# Patient Record
Sex: Male | Born: 1995 | Race: Black or African American | Hispanic: No | Smoking: Current every day smoker
Health system: Southern US, Community
[De-identification: ages and names within clinical notes are randomized; demographics above are authoritative.]

---

## 2017-04-13 ENCOUNTER — Encounter (HOSPITAL_COMMUNITY): Payer: Self-pay | Admitting: Emergency Medicine

## 2017-04-13 ENCOUNTER — Emergency Department (HOSPITAL_COMMUNITY)
Admission: EM | Admit: 2017-04-13 | Discharge: 2017-04-14 | Disposition: A | Discharge status: Home / Self Care | Payer: Medicaid Other | Attending: Emergency Medicine | Admitting: Emergency Medicine

## 2017-04-13 DIAGNOSIS — F17210 Nicotine dependence, cigarettes, uncomplicated: Secondary | ICD-10-CM | POA: Diagnosis not present

## 2017-04-13 DIAGNOSIS — Z202 Contact with and (suspected) exposure to infections with a predominantly sexual mode of transmission: Secondary | ICD-10-CM | POA: Diagnosis present

## 2017-04-13 DIAGNOSIS — Z113 Encounter for screening for infections with a predominantly sexual mode of transmission: Secondary | ICD-10-CM

## 2017-04-13 LAB — URINALYSIS, ROUTINE W REFLEX MICROSCOPIC
Bacteria, UA: NONE SEEN
Bilirubin Urine: NEGATIVE
GLUCOSE, UA: NEGATIVE mg/dL
HGB URINE DIPSTICK: NEGATIVE
Ketones, ur: NEGATIVE mg/dL
NITRITE: NEGATIVE
PH: 6 (ref 5.0–8.0)
Protein, ur: NEGATIVE mg/dL
SPECIFIC GRAVITY, URINE: 1.027 (ref 1.005–1.030)

## 2017-04-13 NOTE — ED Triage Notes (Signed)
Reports exposure to chlamydia. Denies s/s.

## 2017-04-13 NOTE — Discharge Instructions (Signed)
You have been tested if tests are positive, he will be called back for treatment

## 2017-04-13 NOTE — ED Provider Notes (Signed)
  [facility]-EMERGENCY DEPT Provider Note   CSN: [id number] Arrival date & time: 04/13/17  2117     History   Chief Complaint Chief Complaint  Patient presents with  . Exposure to STD    HPI Bradley Russell is a 21 y.o. male.  Girlfriend has been treated for bacterial vaginosis with antibiotics and patinet is concerned he may have an STD.  He has no symptoms of discharge, testicle pain, abdominal pain       History reviewed. No pertinent past medical history.  There are no active problems to display for this patient.   History reviewed. No pertinent surgical history.     Home Medications    Prior to Admission medications   Not on File    Family History No family history on file.  Social History Social History  Substance Use Topics  . Smoking status: Current Every Day Smoker    Types: Cigarettes  . Smokeless tobacco: Never Used  . Alcohol use Yes     Allergies   Patient has no known allergies.   Review of Systems Review of Systems  Genitourinary: Negative for discharge, dysuria and frequency.  Psychiatric/Behavioral: The patient is nervous/anxious.   All other systems reviewed and are negative.    Physical Exam Updated Vital Signs BP 123/87 (BP Location: Left Arm)   Pulse 75   Temp 97.7 F (36.5 C) (Oral)   Resp 16   Ht 5\' 9"  (1.753 m)   Wt 72.6 kg (160 lb)   SpO2 100%   BMI 23.63 kg/m   Physical Exam  Constitutional: He appears well-developed and well-nourished.  HENT:  Head: Normocephalic.  Eyes: Pupils are equal, round, and reactive to light.  Neck: Normal range of motion.  Cardiovascular: Normal rate.   Pulmonary/Chest: Effort normal.  Abdominal: Soft.  Genitourinary: Penis normal. Circumcised. No penile tenderness. No discharge found.  Neurological: He is alert.  Skin: Skin is warm.  Psychiatric: He has a normal mood and affect.  Nursing note and vitals reviewed.    ED Treatments / Results  Labs (all labs ordered are listed,  but only abnormal results are displayed) Labs Reviewed  URINALYSIS, ROUTINE W REFLEX MICROSCOPIC - Abnormal; Notable for the following:       Result Value   Leukocytes, UA SMALL (*)    Squamous Epithelial / LPF 0-5 (*)    All other components within normal limits  GC[facility]IA [facility]    EKG  EKG Interpretation None       Radiology No results found.  Procedures Procedures (including critical care time)  Medications Ordered in ED Medications - No data to display   Initial Impression / Assessment and Plan / ED Course  I have reviewed the triage vital signs and the nursing notes.  Pertinent labs & imaging results that were available during my care of the patient were reviewed by me and considered in my medical decision making (see chart for details).     Will test for GC Chlamydia Will not treat at this time due to lack of symptoms    Final Clinical Impressions(s) / ED Diagnoses   Final diagnoses:  Screen for STD (sexually transmitted disease)    New Prescriptions New Prescriptions   No medications on file     Earley FavorSchulz, Isreal Moline, NP 04/13/17 2322    Loren RacerYelverton, David, MD 04/21/17 [phone]

## 2017-04-14 LAB — GC/CHLAMYDIA PROBE AMP (~~LOC~~) NOT AT ARMC
Chlamydia: POSITIVE — AB
NEISSERIA GONORRHEA: NEGATIVE

## 2018-01-14 ENCOUNTER — Encounter (HOSPITAL_COMMUNITY): Payer: Self-pay | Admitting: Emergency Medicine

## 2018-01-14 ENCOUNTER — Emergency Department (HOSPITAL_COMMUNITY)
Admission: EM | Admit: 2018-01-14 | Discharge: 2018-01-14 | Payer: Medicaid Other | Attending: Emergency Medicine | Admitting: Emergency Medicine

## 2018-01-14 DIAGNOSIS — S0101XA Laceration without foreign body of scalp, initial encounter: Secondary | ICD-10-CM | POA: Insufficient documentation

## 2018-01-14 DIAGNOSIS — W228XXA Striking against or struck by other objects, initial encounter: Secondary | ICD-10-CM | POA: Diagnosis not present

## 2018-01-14 DIAGNOSIS — Y999 Unspecified external cause status: Secondary | ICD-10-CM | POA: Diagnosis not present

## 2018-01-14 DIAGNOSIS — Y92143 Cell of prison as the place of occurrence of the external cause: Secondary | ICD-10-CM | POA: Insufficient documentation

## 2018-01-14 DIAGNOSIS — Y93B9 Activity, other involving muscle strengthening exercises: Secondary | ICD-10-CM | POA: Diagnosis not present

## 2018-01-14 DIAGNOSIS — Z23 Encounter for immunization: Secondary | ICD-10-CM | POA: Diagnosis not present

## 2018-01-14 DIAGNOSIS — S0990XA Unspecified injury of head, initial encounter: Secondary | ICD-10-CM

## 2018-01-14 DIAGNOSIS — F17210 Nicotine dependence, cigarettes, uncomplicated: Secondary | ICD-10-CM | POA: Diagnosis not present

## 2018-01-14 MED ORDER — TETANUS-DIPHTH-ACELL PERTUSSIS 5-2.5-18.5 LF-MCG/0.5 IM SUSP
0.5000 mL | Freq: Once | INTRAMUSCULAR | Status: AC
  Administered 2018-01-14: 0.5 mL via INTRAMUSCULAR
  Filled 2018-01-14: qty 0.5

## 2018-01-14 MED ORDER — LIDOCAINE-EPINEPHRINE (PF) 2 %-1:200000 IJ SOLN
10.0000 mL | Freq: Once | INTRAMUSCULAR | Status: DC
  Filled 2018-01-14: qty 20

## 2018-01-14 NOTE — Discharge Instructions (Signed)
Your head laceration was closed with staples, these will need to be removed in 7 to 10 days.  Keep area clean and dry, you may shower but do not submerge the head underwater.  Keep a close eye in the area for signs of infection such as redness, swelling, worsening pain or drainage, if any of these occur you should be seen sooner for reevaluation.  You were examined today for a head injury and possible concussion. Based on your exam head imaging was not deemed necessary today.  Sometimes serious problems can develop after a head injury. Please return to the emergency department if you experience any of the following symptoms: Repeated vomiting Headache that gets worse and does not go away Loss of consciousness or inability to stay awake at times when you normally would be able to Getting more confused, restless or agitated Convulsions or seizures Difficulty walking or feeling off balance Weakness or numbness Vision changes

## 2018-01-14 NOTE — ED Notes (Signed)
ED Provider at bedside. 

## 2018-01-14 NOTE — ED Triage Notes (Signed)
Patient in custody, reports "doing squats on the toilet and falling." Denies LOC. Approximately one inch laceration to top of head. Bleeding controlled.

## 2018-01-14 NOTE — ED Provider Notes (Signed)
[facility]-EMERGENCY DEPT Provider Note   CSN: [id number] Arrival date & time: 01/14/18  1236     History   Chief Complaint Chief Complaint  Patient presents with  . Laceration    HPI Bradley Russell is a 22 y.o. male.  Bradley Russell is a 22 y.o. Male who is otherwise healthy, presents to the ED from present for evaluation of head injury with scalp laceration.  Patient reports he was doing exercises in his cell, and doing squats jumps off of the toilet when he jumped up and hit his head on the lower bunk.  Patient reports there was initially a lot of bleeding, he denies any loss of consciousness, reports mild headache surrounding laceration, but denies any nausea, vomiting, vision changes, dizziness, numbness, weakness or tingling in any extremities.  Patient has been acting normally since.  He has full recall of the event with no pre-or post event amnesia.  Not exhibiting any confusion or lethargy.  Patient denies any other injury, he did not fall all the way to the floor.  No neck or back pain.  Patient is unsure if his tetanus is up-to-date.  He reports he wash the area out in the shower prior to arrival.     History reviewed. No pertinent past medical history.  There are no active problems to display for this patient.   History reviewed. No pertinent surgical history.      Home Medications    Prior to Admission medications   Not on File    Family History No family history on file.  Social History Social History   Tobacco Use  . Smoking status: Current Every Day Smoker    Types: Cigarettes  . Smokeless tobacco: Never Used  Substance Use Topics  . Alcohol use: Yes  . Drug use: Not on file     Allergies   Patient has no known allergies.   Review of Systems Review of Systems  Constitutional: Negative for chills and fever.  HENT: Negative for congestion, drooling, ear discharge, ear pain, facial swelling, rhinorrhea and trouble  swallowing.   Eyes: Negative for photophobia, pain and visual disturbance.  Gastrointestinal: Negative for nausea and vomiting.  Skin: Positive for wound. Negative for color change and rash.  Neurological: Positive for headaches. Negative for dizziness, tremors, seizures, syncope, facial asymmetry, speech difficulty, weakness, light-headedness and numbness.     Physical Exam Updated Vital Signs BP 133/77 (BP Location: Right Arm)   Pulse 73   Temp 98.5 F (36.9 C) (Oral)   Resp 16   SpO2 100%   Physical Exam  Constitutional: He is oriented to person, place, and time. He appears well-developed and well-nourished. No distress.  HENT:  Head: Normocephalic.  There is a 4 cm laceration over the midportion of the frontal scalp, bleeding controlled, no evidence of foreign bodies, no other bony tenderness on the surrounding scalp, no palpable hematoma, no step-off, negative battle sign, no evidence of hemotympanum or CSF otorrhea   Eyes: Right eye exhibits no discharge. Left eye exhibits no discharge.  Neck: Normal range of motion. Neck supple.  No midline C-spine tenderness, normal range of motion in all directions  Pulmonary/Chest: Effort normal. No respiratory distress.  Neurological: He is alert and oriented to person, place, and time. Coordination normal.  Speech is clear, able to follow commands CN III-XII intact Normal strength in upper and lower extremities bilaterally including dorsiflexion and plantar flexion, strong and equal grip strength Sensation normal to light and sharp  touch Moves extremities without ataxia, coordination intact Normal finger to nose and rapid alternating movements No pronator drift  Skin: Skin is warm and dry. Capillary refill takes less than 2 seconds. He is not diaphoretic.  Psychiatric: He has a normal mood and affect. His behavior is normal.  Nursing note and vitals reviewed.    ED Treatments / Results  Labs (all labs ordered are listed, but only  abnormal results are displayed) Labs Reviewed - No data to display  EKG None  Radiology No results found.  Procedures .Marland KitchenLaceration Repair Date/Time: 01/14/2018 2:58 PM Performed by: Dartha Lodge, PA-C Authorized by: Dartha Lodge, PA-C   Consent:    Consent obtained:  Verbal   Consent given by:  Patient   Risks discussed:  Infection, pain, poor cosmetic result and poor wound healing   Alternatives discussed:  No treatment Anesthesia (see MAR for exact dosages):    Anesthesia method:  Local infiltration   Local anesthetic:  Lidocaine 2% WITH epi Laceration details:    Location:  Scalp   Scalp location:  Frontal   Length (cm):  4   Depth (mm):  5 Repair type:    Repair type:  Simple Pre-procedure details:    Preparation:  Patient was prepped and draped in usual sterile fashion Exploration:    Hemostasis achieved with:  Epinephrine and direct pressure   Wound exploration: entire depth of wound probed and visualized     Wound extent: areolar tissue violated   Treatment:    Area cleansed with:  Saline   Amount of cleaning:  Standard   Irrigation solution:  Sterile saline   Irrigation volume:  40 ml   Irrigation method:  Syringe Skin repair:    Repair method:  Staples   Number of staples:  5 Approximation:    Approximation:  Close Post-procedure details:    Dressing:  Open (no dressing)   (including critical care time)  Medications Ordered in ED Medications  lidocaine-EPINEPHrine (XYLOCAINE W/EPI) 2 %-1:200000 (PF) injection 10 mL (has no administration in time range)  Tdap (BOOSTRIX) injection 0.5 mL (0.5 mLs Intramuscular Given 01/14/18 1431)     Initial Impression / Assessment and Plan / ED Course  I have reviewed the triage vital signs and the nursing notes.  Pertinent labs & imaging results that were available during my care of the patient were reviewed by me and considered in my medical decision making (see chart for details).  Patient presents to the  ED for evaluation after he sustained 4 cm laceration to the mid scalp.  Patient reports he was exercising in his jail cell, was doing jump squats and hit the top of his head on the upper bunk, no loss of consciousness, mild ache surrounding laceration, no nausea or vomiting, vision changes or dizziness.  Normal neurologic exam.  No imaging indicated at this time.  Pressure irrigation performed. Wound explored and base of wound visualized in a bloodless field without evidence of foreign body.  Laceration repair performed with staples which was well tolerated. Tdap updated.  Pt has no comorbidities to effect normal wound healing. Pt discharged without antibiotics.  Discussed  home care with patient and answered questions. Pt to follow-up for wound check and staple removal in 7-10 days; they are to return to the ED sooner for signs of infection. Pt is hemodynamically stable with no complaints prior to dc.    Final Clinical Impressions(s) / ED Diagnoses   Final diagnoses:  Laceration of scalp, initial  encounter  Injury of head, initial encounter    ED Discharge Orders    None       Legrand Rams 01/14/18 1557    Lorre Nick, MD 01/16/18 [phone]

## 2018-01-14 NOTE — ED Notes (Signed)
Bed: [facility]3 Expected date:  Expected time:  Means of arrival:  Comments: 

## 2018-09-06 ENCOUNTER — Other Ambulatory Visit: Payer: Self-pay

## 2018-09-06 ENCOUNTER — Encounter (HOSPITAL_COMMUNITY): Payer: Self-pay | Admitting: Emergency Medicine

## 2018-09-06 ENCOUNTER — Emergency Department (HOSPITAL_COMMUNITY)
Admission: EM | Admit: 2018-09-06 | Discharge: 2018-09-06 | Disposition: A | Discharge status: Home / Self Care | Payer: Medicaid Other | Attending: Emergency Medicine | Admitting: Emergency Medicine

## 2018-09-06 DIAGNOSIS — S01111A Laceration without foreign body of right eyelid and periocular area, initial encounter: Secondary | ICD-10-CM

## 2018-09-06 DIAGNOSIS — Y9389 Activity, other specified: Secondary | ICD-10-CM | POA: Insufficient documentation

## 2018-09-06 DIAGNOSIS — S1081XA Abrasion of other specified part of neck, initial encounter: Secondary | ICD-10-CM | POA: Diagnosis not present

## 2018-09-06 DIAGNOSIS — Y929 Unspecified place or not applicable: Secondary | ICD-10-CM | POA: Insufficient documentation

## 2018-09-06 DIAGNOSIS — Y999 Unspecified external cause status: Secondary | ICD-10-CM | POA: Diagnosis not present

## 2018-09-06 DIAGNOSIS — S61213A Laceration without foreign body of left middle finger without damage to nail, initial encounter: Secondary | ICD-10-CM | POA: Diagnosis not present

## 2018-09-06 DIAGNOSIS — F17210 Nicotine dependence, cigarettes, uncomplicated: Secondary | ICD-10-CM | POA: Insufficient documentation

## 2018-09-06 DIAGNOSIS — T07XXXA Unspecified multiple injuries, initial encounter: Secondary | ICD-10-CM

## 2018-09-06 DIAGNOSIS — S51812A Laceration without foreign body of left forearm, initial encounter: Secondary | ICD-10-CM | POA: Diagnosis not present

## 2018-09-06 DIAGNOSIS — S20319A Abrasion of unspecified front wall of thorax, initial encounter: Secondary | ICD-10-CM | POA: Insufficient documentation

## 2018-09-06 MED ORDER — FLUORESCEIN SODIUM 1 MG OP STRP
1.0000 | ORAL_STRIP | Freq: Once | OPHTHALMIC | Status: AC
  Administered 2018-09-06: 1 via OPHTHALMIC
  Filled 2018-09-06: qty 1

## 2018-09-06 MED ORDER — LIDOCAINE HCL 1 % IJ SOLN
INTRAMUSCULAR | Status: AC
  Administered 2018-09-06: 20 mL
  Filled 2018-09-06: qty 20

## 2018-09-06 MED ORDER — LIDOCAINE HCL (PF) 1 % IJ SOLN
10.0000 mL | Freq: Once | INTRAMUSCULAR | Status: AC
  Administered 2018-09-06: 10 mL via INTRADERMAL

## 2018-09-06 MED ORDER — BACITRACIN ZINC 500 UNIT/GM EX OINT
TOPICAL_OINTMENT | Freq: Once | CUTANEOUS | Status: AC
Start: 2018-09-06 — End: 2018-09-06
  Administered 2018-09-06: 1 via TOPICAL

## 2018-09-06 MED ORDER — LIDOCAINE-EPINEPHRINE (PF) 2 %-1:200000 IJ SOLN
10.0000 mL | Freq: Once | INTRAMUSCULAR | Status: AC
  Administered 2018-09-06: 10 mL via INTRADERMAL
  Filled 2018-09-06: qty 20

## 2018-09-06 MED ORDER — TETRACAINE HCL 0.5 % OP SOLN
1.0000 [drp] | Freq: Once | OPHTHALMIC | Status: AC
  Administered 2018-09-06: 1 [drp] via OPHTHALMIC
  Filled 2018-09-06: qty 4
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
## 2018-09-06 NOTE — ED Notes (Signed)
RN cleaned multiple wounds on patients arms, chest, and face.

## 2018-09-06 NOTE — Discharge Instructions (Signed)
Keep the wound clean and dry for the first 24 hours. After that you may gently clean the wound with soap and water. Make sure to pat dry the wound before covering it with any dressing. You can use topical antibiotic ointment and bandage. Ice and elevate for pain relief.  ° °You can take Tylenol or Ibuprofen as directed for pain. You can alternate Tylenol and Ibuprofen every 4 hours for additional pain relief.  ° °Return to the Emergency Department, your primary care doctor, or the [facility] in 5-7 days for suture removal.  ° °Monitor closely for any signs of infection. Return to the Emergency Department for any worsening redness/swelling of the area that begins to spread, drainage from the site, worsening pain, fever or any other worsening or concerning symptoms.  ° ° °

## 2019-05-16 ENCOUNTER — Ambulatory Visit (HOSPITAL_COMMUNITY)
Admission: EM | Admit: 2019-05-16 | Discharge: 2019-05-16 | Disposition: A | Discharge status: Home / Self Care | Payer: Medicaid Other | Attending: Internal Medicine | Admitting: Internal Medicine

## 2019-05-16 ENCOUNTER — Encounter (HOSPITAL_COMMUNITY): Payer: Self-pay | Admitting: Emergency Medicine

## 2019-05-16 ENCOUNTER — Other Ambulatory Visit: Payer: Self-pay

## 2019-05-16 DIAGNOSIS — H6001 Abscess of right external ear: Secondary | ICD-10-CM | POA: Diagnosis not present

## 2019-05-16 DIAGNOSIS — J069 Acute upper respiratory infection, unspecified: Secondary | ICD-10-CM

## 2019-05-16 MED ORDER — CEPHALEXIN 500 MG PO CAPS: 500.0000 mg | ORAL_CAPSULE | Freq: Four times a day (QID) | ORAL | 0 refills | Status: DC

## 2019-05-16 MED ORDER — BENZONATATE 100 MG PO CAPS: 100.0000 mg | ORAL_CAPSULE | Freq: Three times a day (TID) | ORAL | 0 refills | Status: DC | PRN

## 2019-05-16 NOTE — ED Provider Notes (Signed)
[facility]    CSN: [id number] Arrival date & time: 05/16/19  0920      History   Chief Complaint Chief Complaint  Patient presents with  . Nasal Congestion    HPI Bradley Russell is a 23 y.o. male with a 2-day history of cough, sore throat and runny nose.  Symptoms started yesterday and seemed to have improved overnight.  This morning when he went out to smoke he had an episode of runny nose, mild cough and has a visit to the urgent care. No nausea or vomiting. No diarrhea.   Patient also complains of painful swelling of the right earlobe.  This started about a couple of weeks ago and has been worsening.  No trauma to the ear.  No fever or chills.  She has not tried any over-the-counter medication.  Part of the swelling drained some purulent material mixed with blood. HPI  History reviewed. No pertinent past medical history.  There are no active problems to display for this patient.   History reviewed. No pertinent surgical history.     Home Medications    Prior to Admission medications   Medication Sig Start Date End Date Taking? Authorizing Provider  benzonatate (TESSALON) 100 MG capsule Take 1 capsule (100 mg total) by mouth 3 (three) times daily as needed for cough. 05/16/19   Tasnim Balentine, Myrene Galas, MD  cephALEXin (KEFLEX) 500 MG capsule Take 1 capsule (500 mg total) by mouth 4 (four) times daily. 05/16/19   Lamara Brecht, Myrene Galas, MD    Family History No family history on file.  Social History Social History   Tobacco Use  . Smoking status: Current Every Day Smoker    Types: Cigarettes  . Smokeless tobacco: Never Used  Substance Use Topics  . Alcohol use: Yes  . Drug use: Not on file     Allergies   Patient has no known allergies.   Review of Systems Review of Systems  Constitutional: Positive for activity change. Negative for appetite change, fatigue, fever and unexpected weight change.  HENT: Positive for congestion, postnasal drip, rhinorrhea and  sore throat. Negative for ear discharge, ear pain, mouth sores, nosebleeds, sinus pressure and sinus pain.   Respiratory: Positive for cough. Negative for shortness of breath, wheezing and stridor.   Cardiovascular: Negative.      Physical Exam Triage Vital Signs ED Triage Vitals  Enc Vitals Group     BP 05/16/19 1014 137/72     Pulse Rate 05/16/19 1014 76     Resp 05/16/19 1014 16     Temp 05/16/19 1014 98.1 F (36.7 C)     Temp src --      SpO2 05/16/19 1014 97 %     Weight --      Height --      Head Circumference --      Peak Flow --      Pain Score 05/16/19 1015 0     Pain Loc --      Pain Edu? --      Excl. in [place]? --    No data found.  Updated Vital Signs BP 137/72   Pulse 76   Temp 98.1 F (36.7 C)   Resp 16   SpO2 97%   Visual Acuity Right Eye Distance:   Left Eye Distance:   Bilateral Distance:    Right Eye Near:   Left Eye Near:    Bilateral Near:     Physical Exam Constitutional:  General: He is not in acute distress.    Appearance: Normal appearance. He is not ill-appearing or toxic-appearing.  HENT:     Right Ear: Tympanic membrane and ear canal normal. There is no impacted cerumen.     Left Ear: Tympanic membrane and ear canal normal. There is no impacted cerumen.     Nose: Congestion present.  Eyes:     Extraocular Movements: Extraocular movements intact.     Conjunctiva/sclera: Conjunctivae normal.  Cardiovascular:     Pulses: Normal pulses.     Heart sounds: Normal heart sounds.  Pulmonary:     Effort: Pulmonary effort is normal.     Breath sounds: Normal breath sounds.  Abdominal:     General: Bowel sounds are normal.     Palpations: Abdomen is soft.  Neurological:     Mental Status: He is alert.      UC Treatments / Results  Labs (all labs ordered are listed, but only abnormal results are displayed) Labs Reviewed - No data to display  EKG   Radiology No results found.  Procedures Incision and Drainage   Date/Time: 05/16/2019 4:54 PM Performed by: Merrilee Jansky, MD Authorized by: Merrilee Jansky, MD   Consent:    Consent obtained:  Verbal   Consent given by:  Patient   Risks discussed:  Bleeding and infection   Alternatives discussed:  No treatment and delayed treatment Location:    Type:  Abscess   Location:  Head   Head location:  R external ear Pre-procedure details:    Skin preparation:  Betadine Anesthesia (see MAR for exact dosages):    Anesthesia method:  Local infiltration   Local anesthetic:  Lidocaine 2% w/o epi Procedure type:    Complexity:  Simple Procedure details:    Incision types:  Single straight   Incision depth:  Subcutaneous   Scalpel blade:  10   Wound management:  Probed and deloculated   Drainage:  Bloody and purulent   Drainage amount:  Scant   Wound treatment:  Wound left open   Packing materials:  1/2 in iodoform gauze Post-procedure details:    Patient tolerance of procedure:  Tolerated well, no immediate complications   (including critical care time)  Medications Ordered in UC Medications - No data to display  Initial Impression / Assessment and Plan / UC Course  I have reviewed the triage vital signs and the nursing notes.  Pertinent labs & imaging results that were available during my care of the patient were reviewed by me and considered in my medical decision making (see chart for details).     1. Acute nasopharyngitis:  Supportive care with use of Tylenol as needed for pain or fever Warm salt water gargles Maintain oral fluid intake Patient was tested negative for coronavirus in the past month.  He refuses to call with testing at this time  2.  Right earlobe swelling with drainage: Incision and drainage Cephalexin 500 mg every 6 hours for 7 days. Incision and drainage care teaching was given to patient. Final Clinical Impressions(s) / UC Diagnoses   Final diagnoses:  URI, acute  Abscess of earlobe, right   Discharge  Instructions   None    ED Prescriptions    Medication Sig Dispense Auth. Provider   benzonatate (TESSALON) 100 MG capsule Take 1 capsule (100 mg total) by mouth 3 (three) times daily as needed for cough. 21 capsule Hiroyuki Ozanich, Britta Mccreedy, MD   cephALEXin (KEFLEX) 500 MG capsule Take  1 capsule (500 mg total) by mouth 4 (four) times daily. 20 capsule Dezmon Conover, Britta MccreedyPhilip O, MD     Controlled Substance Prescriptions [place] Controlled Substance Registry consulted? No   Merrilee JanskyLamptey, Allianna Beaubien O, MD 05/18/19 [phone]
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
## 2020-07-10 ENCOUNTER — Other Ambulatory Visit: Payer: Self-pay

## 2020-07-10 ENCOUNTER — Ambulatory Visit (HOSPITAL_COMMUNITY)
Admission: EM | Admit: 2020-07-10 | Discharge: 2020-07-10 | Disposition: A | Discharge status: Home / Self Care | Payer: Medicaid Other | Attending: Emergency Medicine | Admitting: Emergency Medicine

## 2020-07-10 ENCOUNTER — Encounter (HOSPITAL_COMMUNITY): Payer: Self-pay | Admitting: Emergency Medicine

## 2020-07-10 DIAGNOSIS — Z202 Contact with and (suspected) exposure to infections with a predominantly sexual mode of transmission: Secondary | ICD-10-CM | POA: Insufficient documentation

## 2020-07-10 MED ORDER — DOXYCYCLINE HYCLATE 100 MG PO CAPS: 100.0000 mg | ORAL_CAPSULE | Freq: Two times a day (BID) | ORAL | 0 refills | Status: AC
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
## 2020-07-10 NOTE — Discharge Instructions (Addendum)
You are being treated for chlamydia. Refrain from any sexual activity until tests resulted and treatment completed, condoms with all future sexual activity. Follow up with local health dept or your PCP for additional std testing(HIV,syphillis, etc).

## 2020-07-13 LAB — CYTOLOGY, (ORAL, ANAL, URETHRAL) ANCILLARY ONLY
Chlamydia: POSITIVE — AB
Comment: NEGATIVE
Comment: NEGATIVE
Comment: NORMAL
Neisseria Gonorrhea: NEGATIVE
Trichomonas: NEGATIVE

## 2020-09-09 ENCOUNTER — Other Ambulatory Visit: Payer: Self-pay

## 2020-09-09 ENCOUNTER — Encounter (HOSPITAL_COMMUNITY): Payer: Self-pay

## 2020-09-09 ENCOUNTER — Ambulatory Visit (HOSPITAL_COMMUNITY)
Admission: EM | Admit: 2020-09-09 | Discharge: 2020-09-09 | Disposition: A | Discharge status: Home / Self Care | Payer: Medicaid Other | Attending: Emergency Medicine | Admitting: Emergency Medicine

## 2020-09-09 DIAGNOSIS — Z113 Encounter for screening for infections with a predominantly sexual mode of transmission: Secondary | ICD-10-CM | POA: Insufficient documentation

## 2020-09-09 NOTE — ED Provider Notes (Signed)
[facility]    CSN: [id number] Arrival date & time: 09/09/20  1012      History   Chief Complaint Chief Complaint  Patient presents with  . STD testing    HPI Bradley Russell is a 25 y.o. male.   Bradley Russell presents with requests for std screen. No specific known exposure. He was treated for chlamydia in November of last year, states his partner is asking him to re-test. He denies any new partners. He inquires about herpes screening today, denies any sores, lesions, or ulcerations to penis, genitalia or oral mucosa. Denies any penile discharge or dysuria.    ROS per HPI, negative if not otherwise mentioned.      History reviewed. No pertinent past medical history.  Patient Active Problem List   Diagnosis Date Noted  . STD exposure 07/10/2020    History reviewed. No pertinent surgical history.     Home Medications    Prior to Admission medications   Medication Sig Start Date End Date Taking? Authorizing Provider  benzonatate (TESSALON) 100 MG capsule Take 1 capsule (100 mg total) by mouth 3 (three) times daily as needed for cough. 05/16/19   Lamptey, Britta Mccreedy, MD  cephALEXin (KEFLEX) 500 MG capsule Take 1 capsule (500 mg total) by mouth 4 (four) times daily. 05/16/19   Lamptey, Britta Mccreedy, MD    Family History History reviewed. No pertinent family history.  Social History Social History   Tobacco Use  . Smoking status: Current Every Day Smoker    Types: Cigarettes  . Smokeless tobacco: Never Used  Substance Use Topics  . Alcohol use: Yes     Allergies   Patient has no known allergies.   Review of Systems Review of Systems   Physical Exam Triage Vital Signs ED Triage Vitals  Enc Vitals Group     BP 09/09/20 1121 137/67     Pulse Rate 09/09/20 1121 73     Resp 09/09/20 1121 19     Temp 09/09/20 1121 98 F (36.7 C)     Temp src --      SpO2 09/09/20 1121 100 %     Weight --      Height --      Head Circumference --      Peak  Flow --      Pain Score 09/09/20 1120 0     Pain Loc --      Pain Edu? --      Excl. in GC? --    No data found.  Updated Vital Signs BP 137/67   Pulse 73   Temp 98 F (36.7 C)   Resp 19   SpO2 100%   Visual Acuity Right Eye Distance:   Left Eye Distance:   Bilateral Distance:    Right Eye Near:   Left Eye Near:    Bilateral Near:     Physical Exam Constitutional:      Appearance: He is well-developed.  Cardiovascular:     Rate and Rhythm: Normal rate.  Pulmonary:     Effort: Pulmonary effort is normal.  Abdominal:     Palpations: Abdomen is not rigid.     Tenderness: Negative signs include Murphy's sign and McBurney's sign.     Comments: Denies scrotal redness, swelling, pain; denies sores or lesions; gu exam deferred   Skin:    General: Skin is warm and dry.  Neurological:     Mental Status: He is alert and oriented to person,  place, and time.      UC Treatments / Results  Labs (all labs ordered are listed, but only abnormal results are displayed) Labs Reviewed  CYTOLOGY, (ORAL, ANAL, URETHRAL) ANCILLARY ONLY    EKG   Radiology No results found.  Procedures Procedures (including critical care time)  Medications Ordered in UC Medications - No data to display  Initial Impression / Assessment and Plan / UC Course  I have reviewed the triage vital signs and the nursing notes.  Pertinent labs & imaging results that were available during my care of the patient were reviewed by me and considered in my medical decision making (see chart for details).     Std screen via penile cytology collected and pending. Discussed herpes as well and requirement for lesion to collect culture, here at urgent care. Safe sex encouraged. Patient verbalized understanding and agreeable to plan.   Final Clinical Impressions(s) / UC Diagnoses   Final diagnoses:  Screen for STD (sexually transmitted disease)     Discharge Instructions     We will notify of you any  positive findings or if any changes to treatment are needed. If normal or otherwise without concern to your results, we will not call you. Please log on to your MyChart to review your results if interested in so.   Use condoms to prevent stds.  If you develop any sore, lesions, ulcerations or otherwise concerned about herpes lesion you may return for testing to obtain culture of this.    ED Prescriptions    None     PDMP not reviewed this encounter.   Georgetta Haber, NP 09/09/20 1137

## 2020-09-09 NOTE — ED Triage Notes (Signed)
Pt in requesting STD testing  Denies any symptoms

## 2020-09-09 NOTE — Discharge Instructions (Signed)
We will notify of you any positive findings or if any changes to treatment are needed. If normal or otherwise without concern to your results, we will not call you. Please log on to your MyChart to review your results if interested in so.   Use condoms to prevent stds.  If you develop any sore, lesions, ulcerations or otherwise concerned about herpes lesion you may return for testing to obtain culture of this.

## 2020-09-10 LAB — CYTOLOGY, (ORAL, ANAL, URETHRAL) ANCILLARY ONLY
Chlamydia: NEGATIVE
Comment: NEGATIVE
Comment: NEGATIVE
Comment: NORMAL
Neisseria Gonorrhea: NEGATIVE
Trichomonas: NEGATIVE

## 2020-10-05 ENCOUNTER — Encounter (HOSPITAL_COMMUNITY): Payer: Self-pay | Admitting: Emergency Medicine

## 2020-10-05 ENCOUNTER — Other Ambulatory Visit: Payer: Self-pay

## 2020-10-05 ENCOUNTER — Emergency Department (HOSPITAL_COMMUNITY)
Admission: EM | Admit: 2020-10-05 | Discharge: 2020-10-05 | Disposition: A | Discharge status: Home / Self Care | Payer: Medicaid Other | Attending: Emergency Medicine | Admitting: Emergency Medicine

## 2020-10-05 DIAGNOSIS — F17210 Nicotine dependence, cigarettes, uncomplicated: Secondary | ICD-10-CM | POA: Insufficient documentation

## 2020-10-05 DIAGNOSIS — S0101XA Laceration without foreign body of scalp, initial encounter: Secondary | ICD-10-CM | POA: Diagnosis not present

## 2020-10-05 DIAGNOSIS — S30810A Abrasion of lower back and pelvis, initial encounter: Secondary | ICD-10-CM | POA: Diagnosis not present

## 2020-10-05 DIAGNOSIS — S1091XA Abrasion of unspecified part of neck, initial encounter: Secondary | ICD-10-CM | POA: Insufficient documentation

## 2020-10-05 DIAGNOSIS — S40011A Contusion of right shoulder, initial encounter: Secondary | ICD-10-CM | POA: Insufficient documentation

## 2020-10-05 DIAGNOSIS — Z23 Encounter for immunization: Secondary | ICD-10-CM | POA: Diagnosis not present

## 2020-10-05 DIAGNOSIS — S20319A Abrasion of unspecified front wall of thorax, initial encounter: Secondary | ICD-10-CM | POA: Diagnosis not present

## 2020-10-05 DIAGNOSIS — S0990XA Unspecified injury of head, initial encounter: Secondary | ICD-10-CM | POA: Diagnosis present

## 2020-10-05 DIAGNOSIS — T148XXA Other injury of unspecified body region, initial encounter: Secondary | ICD-10-CM

## 2020-10-05 MED ORDER — TETANUS-DIPHTH-ACELL PERTUSSIS 5-2.5-18.5 LF-MCG/0.5 IM SUSY
0.5000 mL | PREFILLED_SYRINGE | Freq: Once | INTRAMUSCULAR | Status: AC
  Administered 2020-10-05: 0.5 mL via INTRAMUSCULAR
  Filled 2020-10-05: qty 0.5

## 2020-10-05 MED ORDER — LIDOCAINE HCL (PF) 1 % IJ SOLN
5.0000 mL | Freq: Once | INTRAMUSCULAR | Status: AC
  Administered 2020-10-05: 5 mL
  Filled 2020-10-05: qty 30

## 2020-10-05 MED ORDER — ACETAMINOPHEN 325 MG PO TABS
650.0000 mg | ORAL_TABLET | Freq: Once | ORAL | Status: AC
  Administered 2020-10-05: 650 mg via ORAL
  Filled 2020-10-05: qty 2
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
## 2020-10-05 NOTE — Discharge Instructions (Addendum)
Staples repair Keep the laceration site dry for the next 24 hours and leave the dressing in place. After 24 hours you may remove the dressing and gently clean the laceration site with antibacterial soap and warm water. Do not scrub the area. Do not soak the area and water for long periods of time. Don't use hydrogen peroxide, iodine-based solutions, or alcohol, which can slow healing, and will probably be painful! Apply topical bacitracin 1-2 times per day for the next 3-5 days. Return to the emergency department in 7-10 days for removal of the staples.  You should return sooner for any signs of infection which would include increased redness around the wound, increased swelling, new drainage of yellow pus.
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
## 2021-09-01 ENCOUNTER — Emergency Department (HOSPITAL_COMMUNITY)
Admission: EM | Admit: 2021-09-01 | Discharge: 2021-09-01 | Payer: Medicaid Other | Attending: Emergency Medicine | Admitting: Emergency Medicine

## 2021-09-01 ENCOUNTER — Other Ambulatory Visit: Payer: Self-pay

## 2021-09-01 ENCOUNTER — Encounter (HOSPITAL_COMMUNITY): Payer: Self-pay

## 2021-09-01 DIAGNOSIS — Z23 Encounter for immunization: Secondary | ICD-10-CM | POA: Insufficient documentation

## 2021-09-01 DIAGNOSIS — S0181XA Laceration without foreign body of other part of head, initial encounter: Secondary | ICD-10-CM | POA: Insufficient documentation

## 2021-09-01 MED ORDER — TETANUS-DIPHTH-ACELL PERTUSSIS 5-2.5-18.5 LF-MCG/0.5 IM SUSY
0.5000 mL | PREFILLED_SYRINGE | Freq: Once | INTRAMUSCULAR | Status: AC
  Administered 2021-09-01: 0.5 mL via INTRAMUSCULAR

## 2021-09-01 NOTE — ED Notes (Signed)
An After Visit Summary was printed and given to the patient. Discharge instructions given and no further questions at this time.  Pt leaving in police custody.  
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
## 2021-09-17 ENCOUNTER — Encounter (HOSPITAL_COMMUNITY): Payer: Self-pay

## 2021-09-17 ENCOUNTER — Other Ambulatory Visit: Payer: Self-pay

## 2021-09-17 ENCOUNTER — Emergency Department (HOSPITAL_COMMUNITY)
Admission: EM | Admit: 2021-09-17 | Discharge: 2021-09-17 | Disposition: A | Discharge status: Home / Self Care | Payer: Medicaid Other | Attending: Emergency Medicine | Admitting: Emergency Medicine

## 2021-09-17 DIAGNOSIS — K047 Periapical abscess without sinus: Secondary | ICD-10-CM | POA: Diagnosis not present

## 2021-09-17 DIAGNOSIS — K0889 Other specified disorders of teeth and supporting structures: Secondary | ICD-10-CM

## 2021-09-17 MED ORDER — AMOXICILLIN-POT CLAVULANATE 875-125 MG PO TABS: 1.0000 | ORAL_TABLET | Freq: Two times a day (BID) | ORAL | 0 refills | Status: DC

## 2021-09-17 NOTE — ED Provider Notes (Signed)
[facility]-EMERGENCY DEPT Provider Note   CSN: [id number] Arrival date & time: 09/17/21  0458     History  Chief Complaint  Patient presents with   Dental Pain    Bradley Russell is a 25 y.o. male with no significant past medical history who presents with right side of mouth pain, tooth pain especially with cold liquids, hot liquids, as well as new swelling below right side of jaw.  Patient reports the hard spot under his jaw developed around 2 days ago.  Patient believes that he is having some issues with his wisdom teeth.  Patient reports it has been a while since he is seeing a dentist, he does believe he has known cavities.  Patient denies sour taste in mouth, history of dental abscess, difficulty swallowing, difficulty breathing.  Denies fever, chills.   Dental Pain     Home Medications Prior to Admission medications   Medication Sig Start Date End Date Taking? Authorizing Provider  amoxicillin-clavulanate (AUGMENTIN) 875-125 MG tablet Take 1 tablet by mouth every 12 (twelve) hours. 09/17/21  Yes Hayle Parisi H, PA-C  benzonatate (TESSALON) 100 MG capsule Take 1 capsule (100 mg total) by mouth 3 (three) times daily as needed for cough. 05/16/19   Lamptey, Britta Mccreedy, MD  cephALEXin (KEFLEX) 500 MG capsule Take 1 capsule (500 mg total) by mouth 4 (four) times daily. 05/16/19   Lamptey, Britta Mccreedy, MD      Allergies    Patient has no known allergies.    Review of Systems   Review of Systems  HENT:  Positive for dental problem.   All other systems reviewed and are negative.  Physical Exam Updated Vital Signs BP (!) 151/84 (BP Location: Right Arm)    Pulse 64    Temp 98.8 F (37.1 C) (Oral)    Resp 17    Ht 5\' 8"  (1.727 m)    SpO2 100%    BMI 25.85 kg/m  Physical Exam Vitals and nursing note reviewed.  Constitutional:      General: He is not in acute distress.    Appearance: Normal appearance.  HENT:     Head: Normocephalic and atraumatic.      Mouth/Throat:     Comments: Patient with some poor dentition noted on the bottom right.  There are no dental abscesses noted, no redness, irritation of gums.  No floor of mouth swelling.  Intact swallow reflex.  Clear oropharynx.  No peritonsillar abscess, uvula midline. Eyes:     General:        Right eye: No discharge.        Left eye: No discharge.  Neck:     Comments: Palpable lymph node submandibular on the right.  There is no redness, it is somewhat tender to the touch, it is firm and mobile.  No other palpable lymph nodes noted in the cervical lymph chain. Cardiovascular:     Rate and Rhythm: Normal rate and regular rhythm.  Pulmonary:     Effort: Pulmonary effort is normal. No respiratory distress.     Breath sounds: No stridor.  Musculoskeletal:        General: No deformity.  Skin:    General: Skin is warm and dry.  Neurological:     Mental Status: He is alert and oriented to person, place, and time.  Psychiatric:        Mood and Affect: Mood normal.        Behavior: Behavior normal.  ED Results / Procedures / Treatments   Labs (all labs ordered are listed, but only abnormal results are displayed) Labs Reviewed - No data to display  EKG None  Radiology No results found.  Procedures Procedures    Medications Ordered in ED Medications - No data to display  ED Course/ Medical Decision Making/ A&P                           Medical Decision Making Risk Prescription drug management.   Is an overall well-appearing patient who presents with concern for dental pain, new right-sided jaw swelling.  My differential diagnosis includes dental pain, dental abscess, peritonsillar abscess, Ludwig angina, superior vena cava versus malignancy.  This is not an exhaustive differential.  Patient with stable vital signs.  He has a risk factor, comorbidity of poor health literacy.  My physical exam is significant for no evidence of floor of mouth swelling, stable vital signs,  palpable submandibular right lymph node.  Patient with no stridor, no difficulty breathing, clear breath sounds throughout.  Discussed with patient that his presentation is consistent with dental pain, dental infection with reactive lymph node.  We will prescribe Augmentin, and encouraged follow-up with dentist.  No evidence of dental abscess, no evidence of peritonsillar abscess, Ludwig angina.  Airway is patent.  Discharged in stable condition at this time, return precautions given.  Encouraged to stick to soft diet, avoid extreme hot or cold foods. Final Clinical Impression(s) / ED Diagnoses Final diagnoses:  Pain, dental  Dental infection    Rx / DC Orders ED Discharge Orders          Ordered    amoxicillin-clavulanate (AUGMENTIN) 875-125 MG tablet  Every 12 hours        09/17/21 0903              Olene Floss, PA-C 09/17/21 1601    Cathren Laine, MD 09/17/21 [phone]

## 2021-09-17 NOTE — ED Triage Notes (Signed)
Pt reports issues with his wisdom teeth and new "quarter sized hard spot" under right jaw. Area developed about 2 days ago.

## 2021-09-17 NOTE — Discharge Instructions (Addendum)
Please use Tylenol or ibuprofen for pain.  You may use 600 mg ibuprofen every 6 hours or 1000 mg of Tylenol every 6 hours.  You may choose to alternate between the 2.  This would be most effective.  Not to exceed 4 g of Tylenol within 24 hours.  Not to exceed 3200 mg ibuprofen 24 hours.  Please follow up with dentistry at your earliest convenience. I have prescribed you an antibiotic. Please take the entire course as prescribed. I recommend taking on a full stomach, taking with probiotics.

## 2022-01-04 ENCOUNTER — Encounter (HOSPITAL_COMMUNITY): Payer: Self-pay

## 2022-01-04 ENCOUNTER — Other Ambulatory Visit: Payer: Self-pay

## 2022-01-04 ENCOUNTER — Emergency Department (HOSPITAL_COMMUNITY)
Admission: EM | Admit: 2022-01-04 | Discharge: 2022-01-04 | Disposition: A | Discharge status: Home / Self Care | Payer: Medicaid Other | Attending: Emergency Medicine | Admitting: Emergency Medicine

## 2022-01-04 ENCOUNTER — Emergency Department (HOSPITAL_COMMUNITY): Payer: Medicaid Other

## 2022-01-04 DIAGNOSIS — S0990XA Unspecified injury of head, initial encounter: Secondary | ICD-10-CM | POA: Diagnosis present

## 2022-01-04 DIAGNOSIS — S60222A Contusion of left hand, initial encounter: Secondary | ICD-10-CM | POA: Insufficient documentation

## 2022-01-04 DIAGNOSIS — S01112A Laceration without foreign body of left eyelid and periocular area, initial encounter: Secondary | ICD-10-CM | POA: Diagnosis not present

## 2022-01-04 DIAGNOSIS — S63501A Unspecified sprain of right wrist, initial encounter: Secondary | ICD-10-CM | POA: Insufficient documentation
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
## 2022-01-04 NOTE — Discharge Instructions (Addendum)
Your x-rays and CT scan did not show any injuries or abnormal findings. ?Use the Ace wrap and the wrist brace.  I have applied glue to the wound on your left eyebrow area which should heal the area and peel off when it is ready. ?Return to the ER if you start to experience worsening pain, increased swelling, blurry vision, loss of consciousness, chest pain. ?
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
## 2022-06-09 ENCOUNTER — Ambulatory Visit (HOSPITAL_COMMUNITY)
Admission: EM | Admit: 2022-06-09 | Discharge: 2022-06-09 | Disposition: A | Discharge status: Home / Self Care | Payer: Medicaid Other | Attending: Emergency Medicine | Admitting: Emergency Medicine

## 2022-06-09 ENCOUNTER — Ambulatory Visit (INDEPENDENT_AMBULATORY_CARE_PROVIDER_SITE_OTHER): Payer: Medicaid Other

## 2022-06-09 ENCOUNTER — Encounter (HOSPITAL_COMMUNITY): Payer: Self-pay | Admitting: Emergency Medicine

## 2022-06-09 DIAGNOSIS — S60451A Superficial foreign body of left index finger, initial encounter: Secondary | ICD-10-CM

## 2022-06-09 DIAGNOSIS — Z23 Encounter for immunization: Secondary | ICD-10-CM | POA: Diagnosis not present

## 2022-06-09 DIAGNOSIS — M79645 Pain in left finger(s): Secondary | ICD-10-CM

## 2022-06-09 MED ORDER — CEFTRIAXONE SODIUM 500 MG IJ SOLR
500.0000 mg | Freq: Once | INTRAMUSCULAR | Status: AC
  Administered 2022-06-09: 500 mg via INTRAMUSCULAR

## 2022-06-09 MED ORDER — TETANUS-DIPHTH-ACELL PERTUSSIS 5-2.5-18.5 LF-MCG/0.5 IM SUSY
0.5000 mL | PREFILLED_SYRINGE | Freq: Once | INTRAMUSCULAR | Status: AC
  Administered 2022-06-09: 0.5 mL via INTRAMUSCULAR

## 2022-06-09 MED ORDER — DOXYCYCLINE HYCLATE 100 MG PO CAPS: 100.0000 mg | ORAL_CAPSULE | Freq: Two times a day (BID) | ORAL | 0 refills | Status: AC

## 2022-06-09 MED ORDER — CEFTRIAXONE SODIUM 500 MG IJ SOLR
INTRAMUSCULAR | Status: AC
  Filled 2022-06-09: qty 500

## 2022-06-09 MED ORDER — TETANUS-DIPHTH-ACELL PERTUSSIS 5-2.5-18.5 LF-MCG/0.5 IM SUSY
PREFILLED_SYRINGE | INTRAMUSCULAR | Status: AC
  Filled 2022-06-09: qty 0.5

## 2022-06-09 MED ORDER — LIDOCAINE HCL 2 % IJ SOLN
INTRAMUSCULAR | Status: AC
  Filled 2022-06-09: qty 20

## 2022-06-09 MED ORDER — LIDOCAINE HCL (PF) 1 % IJ SOLN
INTRAMUSCULAR | Status: AC
  Filled 2022-06-09: qty 2

## 2022-06-09 NOTE — ED Provider Notes (Addendum)
[facility]    CSN: [id number] Arrival date & time: 06/09/22  1215      History   Chief Complaint Chief Complaint  Patient presents with   Foreign Body in Skin    HPI Bradley Russell is a 26 y.o. male.  Patient complaining of foreign body in left index finger, this happened 2 nights ago.  Patient states onset of symptoms began after "running through the woods and coming into contact with some tree branches".  Patient states upon onset he felt the sensation of a possible tree branch being in finger and had an abrasion on his finger.  Patient states he has increased swelling and pain to finger.  Patient denies any numbness, tingling, fever, nausea, or vomiting.  Patient states he has been holding his hand up to decrease pain.  Patient has not taken any medications for symptoms. Patient is unaware as to when last Tdap vaccination occurred.   HPI  History reviewed. No pertinent past medical history.  Patient Active Problem List   Diagnosis Date Noted   STD exposure 07/10/2020    History reviewed. No pertinent surgical history.     Home Medications    Prior to Admission medications   Medication Sig Start Date End Date Taking? Authorizing Provider  doxycycline (VIBRAMYCIN) 100 MG capsule Take 1 capsule (100 mg total) by mouth 2 (two) times daily. 06/09/22  Yes Debby Freiberg, NP    Family History History reviewed. No pertinent family history.  Social History Social History   Tobacco Use   Smoking status: Every Day    Types: Cigarettes   Smokeless tobacco: Never  Substance Use Topics   Alcohol use: Yes     Allergies   Patient has no known allergies.   Review of Systems Review of Systems  Constitutional:  Negative for chills, fatigue and fever.  Gastrointestinal:  Negative for nausea and vomiting.  Musculoskeletal:        LFT index finger swelling and pain.   Skin:  Positive for color change (Reports redness to LFT index finger) and wound  (Reports from entry site of foreign body in LFT index finger).  Neurological:  Negative for numbness.     Physical Exam Triage Vital Signs ED Triage Vitals  Enc Vitals Group     BP 06/09/22 1311 110/63     Pulse Rate 06/09/22 1311 85     Resp 06/09/22 1311 14     Temp 06/09/22 1311 98.7 F (37.1 C)     Temp Source 06/09/22 1311 Oral     SpO2 06/09/22 1311 99 %     Weight --      Height --      Head Circumference --      Peak Flow --      Pain Score 06/09/22 1310 9     Pain Loc --      Pain Edu? --      Excl. in GC? --    No data found.  Updated Vital Signs BP 110/63 (BP Location: Left Arm)   Pulse 85   Temp 98.7 F (37.1 C) (Oral)   Resp 14   SpO2 99%      Physical Exam Vitals and nursing note reviewed.  Constitutional:      Appearance: Normal appearance. He is not ill-appearing.  Musculoskeletal:     Right hand: Swelling, deformity and tenderness present. Decreased range of motion. Normal sensation. Normal capillary refill. Normal pulse.     Left hand:  Normal.     Comments: Swelling, deformity, decreased ROM, and tenderness to the whole span of LFT index finger.   Skin:    General: Skin is warm.     Capillary Refill: Capillary refill takes less than 2 seconds.     Findings: Erythema (LFT index finger) and wound present.     Comments: Wound present to dorsal side of LFT index finger. Purulent drainage at sight of injury. Picture of finger was placed in chart under media tab.   Neurological:     Mental Status: He is alert.      UC Treatments / Results  Labs (all labs ordered are listed, but only abnormal results are displayed) Labs Reviewed - No data to display  EKG   Radiology DG Finger Index Left  Result Date: 06/09/2022 CLINICAL DATA:  Swelling and erythema EXAM: LEFT INDEX FINGER 3V COMPARISON:  None Available. FINDINGS: No evidence of fracture or dislocation. No evidence of arthropathy or other focal bone abnormality. Soft tissue swelling of the  index finger. IMPRESSION: No acute osseous abnormality. Electronically Signed   By: Allegra Lai M.D.   On: 06/09/2022 14:36    Procedures Foreign Body Removal  Date/Time: 06/09/2022 4:02 PM  Performed by: Debby Freiberg, NP Authorized by: Debby Freiberg, NP   Consent:    Consent obtained:  Verbal   Consent given by:  Patient   Risks, benefits, and alternatives were discussed: yes     Risks discussed:  Infection, incomplete removal, pain and bleeding   Alternatives discussed:  No treatment Universal protocol:    Procedure explained and questions answered to patient or proxy's satisfaction: yes     Imaging studies available: yes     Patient identity confirmed:  Verbally with patient Location:    Location:  Finger   Finger location:  L index finger   Depth:  Intradermal Pre-procedure details:    Imaging:  X-ray   Neurovascular status: intact   Anesthesia:    Anesthesia method:  Local infiltration   Local anesthetic:  Lidocaine 2% w/o epi Procedure type:    Procedure complexity:  Complex Procedure details:    Incision length:  1 cm   Localization method:  Probed   Dissection of underlying tissues: no     Removal mechanism:  Hemostat, irrigation and forceps   Foreign bodies recovered: a black fragment of unknown object. Post-procedure details:    Neurovascular status: intact     Confirmation:  Residual foreign bodies remain   Skin closure:  None   Dressing:  Bulky dressing   Procedure completion:  Tolerated well, no immediate complications Comments:     Patient will be following up with Chiaramonti MD, [facility]   (including critical care time)  Medications Ordered in UC Medications  Tdap (BOOSTRIX) injection 0.5 mL (0.5 mLs Intramuscular Given 06/09/22 1345)  cefTRIAXone (ROCEPHIN) injection 500 mg (500 mg Intramuscular Given 06/09/22 1522)    Initial Impression / Assessment and Plan / UC Course  I have reviewed the triage vital signs and the  nursing notes.  Pertinent labs & imaging results that were available during my care of the patient were reviewed by me and considered in my medical decision making (see chart for details).  Clinical Course as of 06/09/22 1609  Thu Jun 09, 2022  1426 DG Finger Index Left [NW]  1436 DG Finger Index Left [NW]    Clinical Course User Index [NW] Debby Freiberg, NP    Patient was  seen for foreign body in left index finger. Tdap vaccination was given in office.  X-ray of the left index was performed which was negative for any fracture or any trauma to joint/bone. Patient was made aware of the necessity for referral to hand specialist due to extent of the injury.  Hand consult was done, this writer spoke to [employer] and was told to make an incision on the medial part of the finger and place the patient on antibiotics and for patient to schedule an appointment with Chiaramonti MD [facility] for follow up.  Procedure was performed at the medial aspect of the finger where puncture wound was located where initial injury occurred.  Fluid and some black debris was removed from the site.  The procedure was unsuccessful in taking out the actual foreign object.  The site was left open so that drainage has an exit point, possibly a foreign object will begin to be pushed out by the body. Rocephin 500 mg was given in office and doxycycline was sent to the pharmacy for possible infection of the finger due to the extent of swelling and pain.  Patient was made aware of the importance of finishing the antibiotics.  Patient was made aware of red flag symptoms that would warrant an emergency department visit.  Patient verbalized understanding of instructions. Final Clinical Impressions(s) / UC Diagnoses   Final diagnoses:  Foreign body of left index finger     Discharge Instructions      I have sent doxycycline to the pharmacy, this is an antibiotic to prevent further infection of your hand,  you will take this medication twice a day for the next 7 days.  Please go ahead and schedule an appointment with the hand specialist as attached to your paperwork, you need to schedule an appointment and ensure that you follow-up as soon as possible.  Please go to the emergency department if you have loss of sensation/feeling in your hands, worsening pain, or change in color to your hand.   These keep the site of of the injury clean.  There's information at the back of your paperwork on how to keep the site clean.     ED Prescriptions     Medication Sig Dispense Auth. Provider   doxycycline (VIBRAMYCIN) 100 MG capsule Take 1 capsule (100 mg total) by mouth 2 (two) times daily. 14 capsule Flossie Dibble, NP      PDMP not reviewed this encounter.   Flossie Dibble, NP 06/09/22 1457    Flossie Dibble, NP 06/09/22 1612    Flossie Dibble, NP 06/09/22 1612    Flossie Dibble, NP 06/13/22 [phone]

## 2022-06-09 NOTE — Discharge Instructions (Addendum)
I have sent doxycycline to the pharmacy, this is an antibiotic to prevent further infection of your hand, you will take this medication twice a day for the next 7 days.  Please go ahead and schedule an appointment with the hand specialist as attached to your paperwork, you need to schedule an appointment and ensure that you follow-up as soon as possible.  Please go to the emergency department if you have loss of sensation/feeling in your hands, worsening pain, or change in color to your hand.   These keep the site of of the injury clean.  There's information at the back of your paperwork on how to keep the site clean.

## 2022-06-09 NOTE — ED Triage Notes (Signed)
Pt has splinter in left index finger for about 2 days. C/o pain and swelling.

## 2023-02-27 ENCOUNTER — Emergency Department (HOSPITAL_COMMUNITY)
Admission: EM | Admit: 2023-02-27 | Discharge: 2023-02-27 | Payer: Medicaid Other | Attending: Emergency Medicine | Admitting: Emergency Medicine

## 2023-02-27 ENCOUNTER — Encounter (HOSPITAL_COMMUNITY): Payer: Self-pay | Admitting: Emergency Medicine

## 2023-02-27 DIAGNOSIS — Z23 Encounter for immunization: Secondary | ICD-10-CM | POA: Insufficient documentation

## 2023-02-27 DIAGNOSIS — S61412A Laceration without foreign body of left hand, initial encounter: Secondary | ICD-10-CM | POA: Insufficient documentation

## 2023-02-27 DIAGNOSIS — W228XXA Striking against or struck by other objects, initial encounter: Secondary | ICD-10-CM | POA: Diagnosis not present

## 2023-02-27 DIAGNOSIS — S60521A Blister (nonthermal) of right hand, initial encounter: Secondary | ICD-10-CM | POA: Diagnosis not present

## 2023-02-27 DIAGNOSIS — T148XXA Other injury of unspecified body region, initial encounter: Secondary | ICD-10-CM

## 2023-02-27 DIAGNOSIS — S61210A Laceration without foreign body of right index finger without damage to nail, initial encounter: Secondary | ICD-10-CM | POA: Insufficient documentation

## 2023-02-27 DIAGNOSIS — S6991XA Unspecified injury of right wrist, hand and finger(s), initial encounter: Secondary | ICD-10-CM | POA: Diagnosis present

## 2023-02-27 DIAGNOSIS — S60522A Blister (nonthermal) of left hand, initial encounter: Secondary | ICD-10-CM | POA: Insufficient documentation

## 2023-02-27 DIAGNOSIS — T07XXXA Unspecified multiple injuries, initial encounter: Secondary | ICD-10-CM

## 2023-02-27 MED ORDER — MUPIROCIN CALCIUM 2 % EX CREA: 1.0000 | TOPICAL_CREAM | Freq: Two times a day (BID) | CUTANEOUS | 0 refills | Status: AC

## 2023-02-27 MED ORDER — LIDOCAINE-EPINEPHRINE 2 %-1:100000 IJ SOLN
1.7000 mL | Freq: Once | INTRAMUSCULAR | Status: AC
  Administered 2023-02-27: 1.7 mL
  Filled 2023-02-27: qty 1

## 2023-02-27 MED ORDER — BACITRACIN ZINC 500 UNIT/GM EX OINT
TOPICAL_OINTMENT | Freq: Two times a day (BID) | CUTANEOUS | Status: DC
  Administered 2023-02-27: 4 via TOPICAL
  Filled 2023-02-27: qty 2.7

## 2023-02-27 MED ORDER — CEPHALEXIN 500 MG PO CAPS: 500.0000 mg | ORAL_CAPSULE | Freq: Four times a day (QID) | ORAL | 0 refills | Status: AC

## 2023-02-27 MED ORDER — TETANUS-DIPHTH-ACELL PERTUSSIS 5-2.5-18.5 LF-MCG/0.5 IM SUSY
0.5000 mL | PREFILLED_SYRINGE | Freq: Once | INTRAMUSCULAR | Status: AC
  Administered 2023-02-27: 0.5 mL via INTRAMUSCULAR
  Filled 2023-02-27: qty 0.5

## 2023-02-27 MED ORDER — LIDOCAINE HCL (PF) 1 % IJ SOLN
2.0000 mL | Freq: Once | INTRAMUSCULAR | Status: AC
Start: 2023-02-27 — End: 2023-02-27
  Administered 2023-02-27: 2 mL
  Filled 2023-02-27: qty 30

## 2023-02-27 NOTE — ED Provider Notes (Signed)
[place] [facility] DEPARTMENT AT [facility] Provider Note   CSN: [id number] Arrival date & time: 02/27/23  0458     History  Chief Complaint  Patient presents with   Laceration    Bradley Russell is a 27 y.o. male.  The history is provided by the patient.  Laceration Location:  Hand Hand laceration location:  Dorsum of R hand, dorsum of L hand, L palm, R palm, L fingers and R fingers Length:  Multiple lacerations that are denuded, 2 cm laceration over right index PIP Depth:  Through dermis Quality: jagged   Bleeding: controlled   Time since incident: minutes. Associated symptoms: no fever   Patient with [employer] and hit 2 tail lights and then also scraped hands on ground.  Has denuded blisters and skin on the palms and denuded lacerations on and onw jagged laceration R index PIP.  Unknown tetanus.       Home Medications Prior to Admission medications   Medication Sig Start Date End Date Taking? Authorizing Provider  cephALEXin (KEFLEX) 500 MG capsule Take 1 capsule (500 mg total) by mouth 4 (four) times daily. 02/27/23  Yes Quandarius Nill, MD  mupirocin cream (BACTROBAN) 2 % Apply 1 Application topically 2 (two) times daily. 02/27/23  Yes Taegan Haider, MD  doxycycline (VIBRAMYCIN) 100 MG capsule Take 1 capsule (100 mg total) by mouth 2 (two) times daily. 06/09/22   Debby Freiberg, NP      Allergies    Patient has no known allergies.    Review of Systems   Review of Systems  Constitutional:  Negative for fever.  HENT:  Negative for facial swelling.   Respiratory:  Negative for wheezing and stridor.   Gastrointestinal:  Negative for vomiting.  Musculoskeletal:  Negative for joint swelling.  Skin:  Positive for wound.  Neurological:  Negative for dizziness and facial asymmetry.  All other systems reviewed and are negative.   Physical Exam Updated Vital Signs BP 121/77 (BP Location: Right Arm)   Pulse 89   Temp 98.2 F (36.8 C) (Oral)   Resp 18   Wt  77.1 kg   SpO2 99%   BMI 25.85 kg/m  Physical Exam Vitals and nursing note reviewed.  Constitutional:      General: He is not in acute distress.    Appearance: He is well-developed. He is not diaphoretic.  HENT:     Head: Normocephalic and atraumatic.     Nose: Nose normal.  Eyes:     Conjunctiva/sclera: Conjunctivae normal.     Pupils: Pupils are equal, round, and reactive to light.  Cardiovascular:     Rate and Rhythm: Normal rate and regular rhythm.  Pulmonary:     Effort: Pulmonary effort is normal.     Breath sounds: Normal breath sounds. No wheezing or rales.  Abdominal:     General: Bowel sounds are normal.     Palpations: Abdomen is soft.     Tenderness: There is no abdominal tenderness. There is no guarding or rebound.  Musculoskeletal:        General: No tenderness. Normal range of motion.     Right wrist: No bony tenderness, snuff box tenderness or crepitus. Normal pulse.     Left wrist: No bony tenderness, snuff box tenderness or crepitus. Normal pulse.     Right hand: Laceration present. No tenderness or bony tenderness. Normal range of motion. Normal strength. Normal sensation. There is no disruption of two-point discrimination.     Left  hand: No tenderness or bony tenderness. Normal range of motion. Normal strength. Normal sensation. There is no disruption of two-point discrimination.       Hands:     Cervical back: Normal range of motion and neck supple.  Skin:    General: Skin is warm and dry.  Neurological:     General: No focal deficit present.     Mental Status: He is alert and oriented to person, place, and time.     Deep Tendon Reflexes: Reflexes normal.  Psychiatric:        Mood and Affect: Mood normal.        Behavior: Behavior normal.     ED Results / Procedures / Treatments   Labs (all labs ordered are listed, but only abnormal results are displayed) Labs Reviewed - No data to display  EKG None  Radiology No results  found.  Procedures .Marland KitchenLaceration Repair  Date/Time: 02/27/2023 6:53 AM  Performed by: Cy Blamer, MD Authorized by: Cy Blamer, MD   Consent:    Consent obtained:  Verbal   Consent given by:  Patient   Risks discussed:  Infection, need for additional repair, nerve damage, pain, poor cosmetic result, poor wound healing, retained foreign body, tendon damage and vascular damage   Alternatives discussed:  No treatment Universal protocol:    Patient identity confirmed:  Arm band Anesthesia:    Anesthesia method:  Local infiltration   Local anesthetic:  Lidocaine 1% w/o epi Laceration details:    Location:  Finger   Finger location:  R index finger   Length (cm):  2   Depth (mm):  1 Pre-procedure details:    Preparation:  Patient was prepped and draped in usual sterile fashion Exploration:    Imaging outcome: foreign body not noted     Wound exploration: wound explored through full range of motion     Wound extent: fascia not violated, no foreign body, no signs of injury, no nerve damage, no tendon damage, no underlying fracture and no vascular damage     Contaminated: no   Treatment:    Area cleansed with:  Povidone-iodine, chlorhexidine and saline   Amount of cleaning:  Extensive   Irrigation solution:  Sterile saline   Debridement:  None Skin repair:    Repair method:  Sutures   Suture size:  4-0   Suture material:  Nylon   Suture technique:  Simple interrupted   Number of sutures:  4 Approximation:    Approximation:  Close Repair type:    Repair type:  Simple Post-procedure details:    Dressing:  Sterile dressing   Procedure completion:  Tolerated well, no immediate complications Comments:     Superficial dorsal lacerations dermabond applied      Medications Ordered in ED Medications  bacitracin ointment (has no administration in time range)  Tdap (BOOSTRIX) injection 0.5 mL (0.5 mLs Intramuscular Given 02/27/23 0509)  lidocaine-EPINEPHrine (XYLOCAINE W/EPI)  2 %-1:100000 (with pres) injection 1.7 mL (1.7 mLs Other Given by Other 02/27/23 1610)  lidocaine (PF) (XYLOCAINE) 1 % injection 2 mL (2 mLs Other Given by Other 02/27/23 9604)    ED Course/ Medical Decision Making/ A&P                             Medical Decision Making Lacerations from punching tail light   Amount and/or Complexity of Data Reviewed Independent Historian:     Details: Police see above  External  Data Reviewed: notes.    Details: Previous notes reviewed   Risk OTC drugs. Prescription drug management. Risk Details: Will start keflex due to dirty nature of wounds.  Mupirocin also ordered BID.  Suture removal in 10 days at urgent care.  Stable for discharge.  Strict return    Final Clinical Impression(s) / ED Diagnoses Final diagnoses:  Abrasions of multiple sites  Superficial laceration  Blister  Laceration of right index finger without foreign body without damage to nail, initial encounter   Return for intractable cough, coughing up blood, fevers > 100.4 unrelieved by medication, shortness of breath, intractable vomiting, chest pain, shortness of breath, weakness, numbness, changes in speech, facial asymmetry, abdominal pain, passing out, Inability to tolerate liquids or food, cough, altered mental status or any concerns. No signs of systemic illness or infection. The patient is nontoxic-appearing on exam and vital signs are within normal limits.  I have reviewed the triage vital signs and the nursing notes. Pertinent labs & imaging results that were available during my care of the patient were reviewed by me and considered in my medical decision making (see chart for details). After history, exam, and medical workup I feel the patient has been appropriately medically screened and is safe for discharge home. Pertinent diagnoses were discussed with the patient. Patient was given return precautions.  Rx / DC Orders ED Discharge Orders          Ordered    mupirocin cream  (BACTROBAN) 2 %  2 times daily        02/27/23 0539    cephALEXin (KEFLEX) 500 MG capsule  4 times daily        02/27/23 0639              Yovanny Coats, MD 02/27/23 1610

## 2023-02-27 NOTE — ED Triage Notes (Signed)
Pt arrives with [employer] with pain and lacerations to hands after punching out 2 tail lights with his bare hands. EDP at bedside during triage. Last tetanus unknown.

## 2023-04-17 IMAGING — DX DG WRIST COMPLETE 3+V*R*
4 series · 4 of 4 positions shown · non-contrast
Comparison: None Available.

CLINICAL DATA: Right wrist injury

EXAM:
RIGHT WRIST - COMPLETE 3+ VIEW

[wrist ap (1 of 2)]
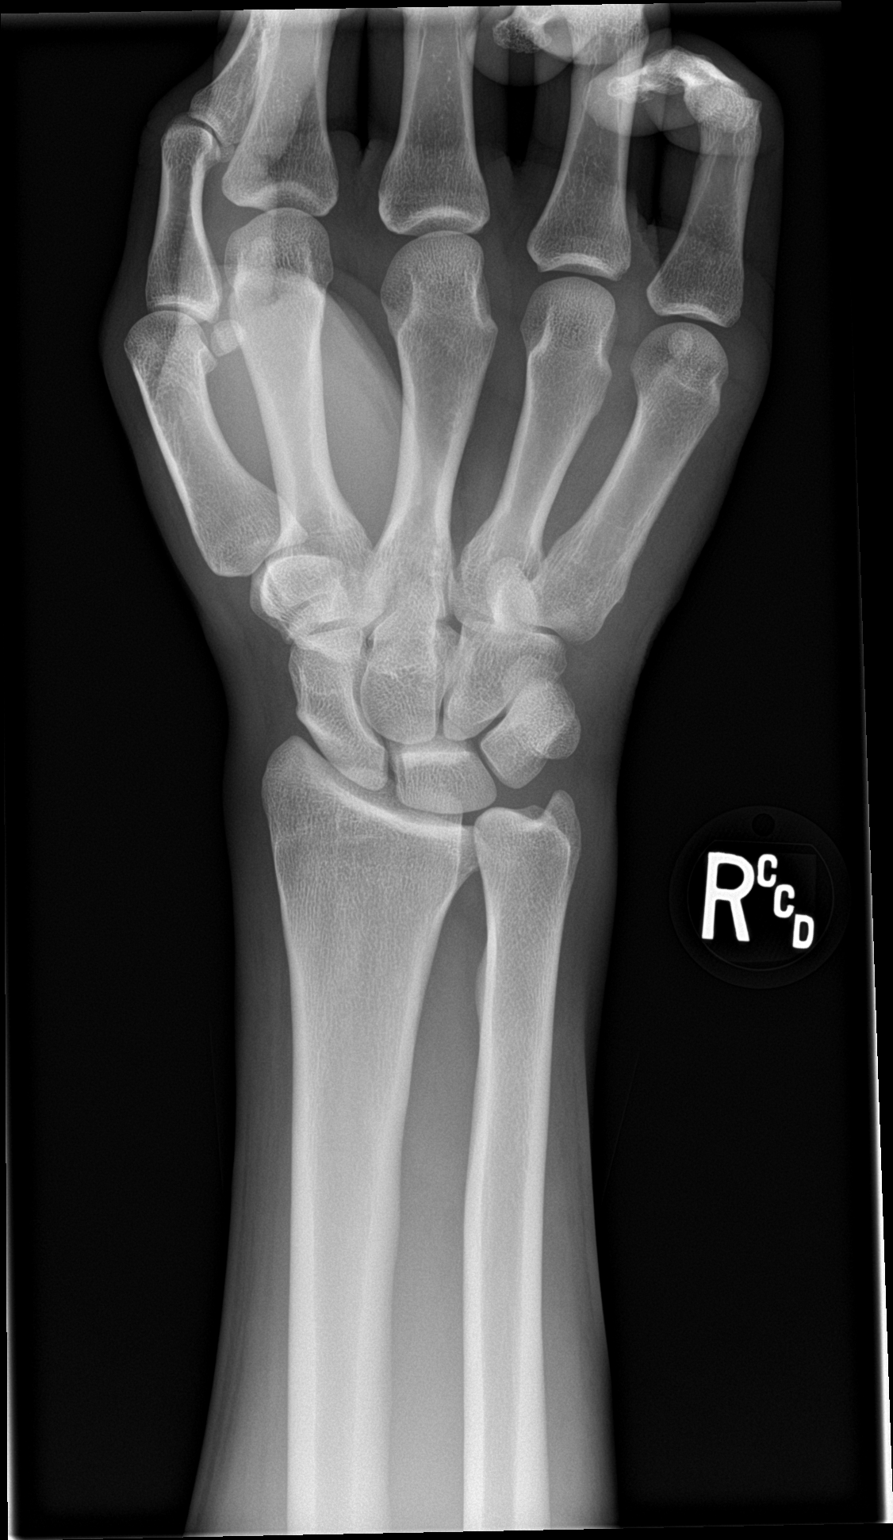

[wrist obl]
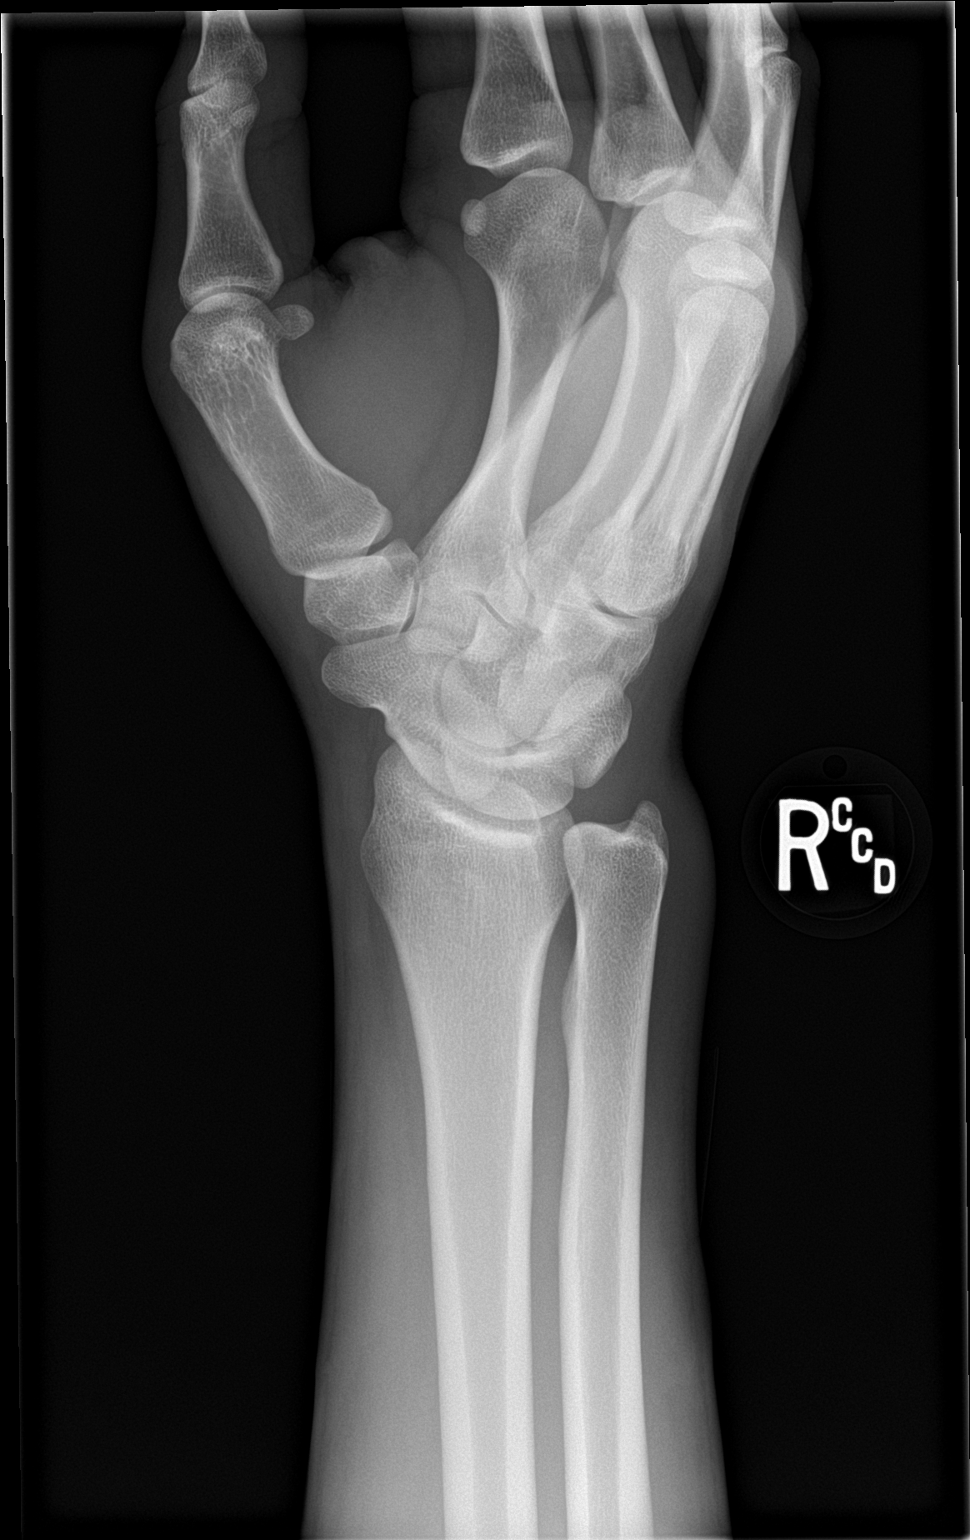

[wrist lat]
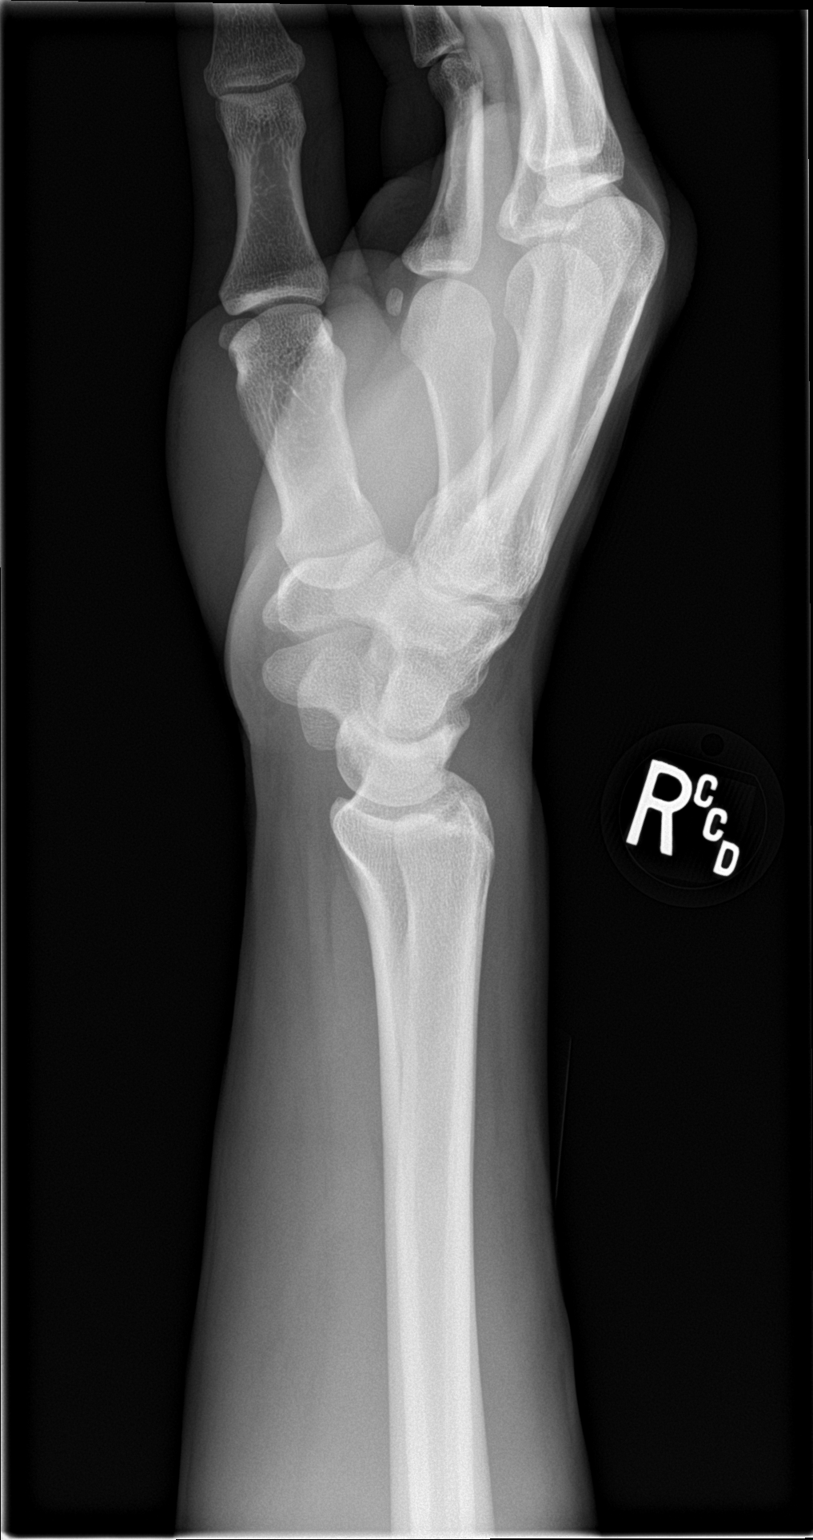

[wrist ap (2 of 2)]
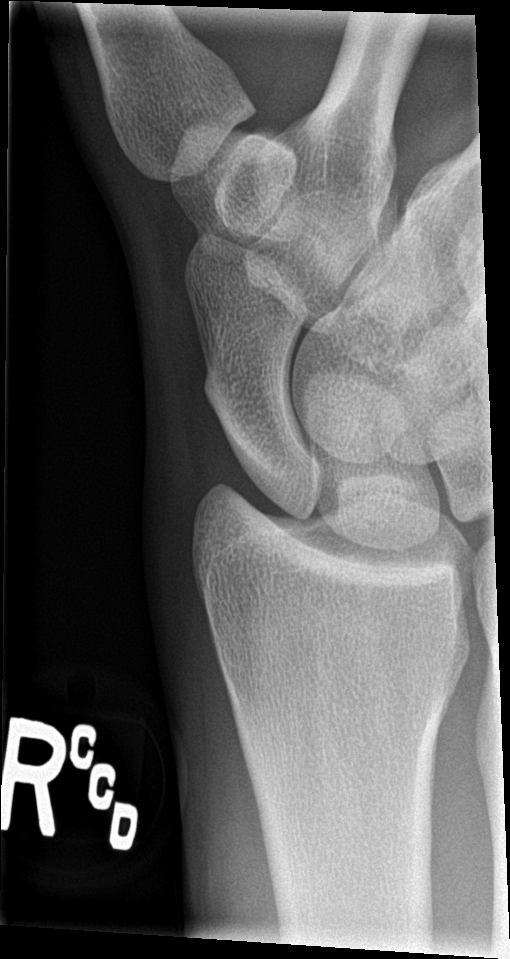

[4 of 4 positions shown; findings below may reference images not displayed]

FINDINGS: There is no evidence of fracture or dislocation. There is no
evidence of arthropathy or other focal bone abnormality. Soft tissue
swelling dorsally over the distal ulna.
IMPRESSION: Soft tissue swelling with no acute fracture identified.
